# Patient Record
Sex: Male | Born: 1985 | Hispanic: Yes | Marital: Married | Smoking: Former smoker
Health system: Southern US, Community
[De-identification: ages and names within clinical notes are randomized; demographics above are authoritative.]

---

## 2008-12-29 ENCOUNTER — Ambulatory Visit: Payer: Self-pay | Admitting: Internal Medicine

## 2010-08-16 ENCOUNTER — Ambulatory Visit: Payer: Self-pay | Admitting: Internal Medicine

## 2010-09-05 ENCOUNTER — Emergency Department: Payer: Self-pay | Admitting: Emergency Medicine

## 2011-05-05 ENCOUNTER — Emergency Department: Payer: Self-pay | Admitting: Internal Medicine

## 2011-05-17 ENCOUNTER — Emergency Department: Payer: Self-pay | Admitting: *Deleted

## 2011-05-30 ENCOUNTER — Emergency Department: Payer: Self-pay | Admitting: Emergency Medicine

## 2011-08-15 ENCOUNTER — Emergency Department: Payer: Self-pay | Admitting: *Deleted

## 2011-08-15 LAB — URINALYSIS, COMPLETE
Blood: NEGATIVE
Glucose,UR: NEGATIVE mg/dL (ref 0–75)
Leukocyte Esterase: NEGATIVE
Nitrite: NEGATIVE
Ph: 7 (ref 4.5–8.0)
Specific Gravity: 1.006 (ref 1.003–1.030)
Squamous Epithelial: NONE SEEN

## 2012-01-11 ENCOUNTER — Ambulatory Visit: Payer: Self-pay | Admitting: Nephrology

## 2012-02-06 ENCOUNTER — Ambulatory Visit: Payer: Self-pay | Admitting: Internal Medicine

## 2012-02-06 LAB — URINALYSIS, COMPLETE
Bacteria: NEGATIVE
Bilirubin,UR: NEGATIVE
Blood: NEGATIVE
Glucose,UR: NEGATIVE mg/dL (ref 0–75)
Ketone: NEGATIVE
Leukocyte Esterase: NEGATIVE
Nitrite: NEGATIVE
Ph: 5 (ref 4.5–8.0)
Protein: NEGATIVE
RBC,UR: NONE SEEN /HPF (ref 0–5)
Specific Gravity: >=1.03 (ref 1.003–1.030)
Squamous Epithelial: NONE SEEN

## 2012-07-10 ENCOUNTER — Ambulatory Visit: Payer: Self-pay | Admitting: Family Medicine

## 2013-01-17 IMAGING — CT CT STONE STUDY
1 of 2 series · 15 of 32 positions shown, 19 images · non-contrast
Comparison: none

REASON FOR EXAM: Horseshoe Kidney Abd Pain Kidney Stones
COMMENTS:

[Series 2: abd 3mm wo 3.0 i40f 3 · axial · 0.78mm/px · z∈[-979,-592]mm · 15 of 146 slices shown, 19 images]
[im 11/146  soft-tissue]
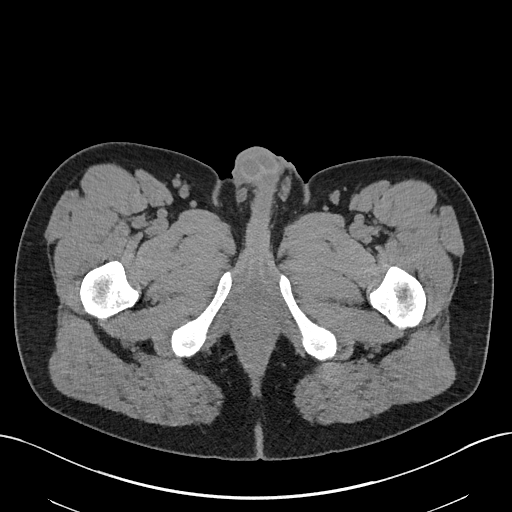
[im 11/146  bone]
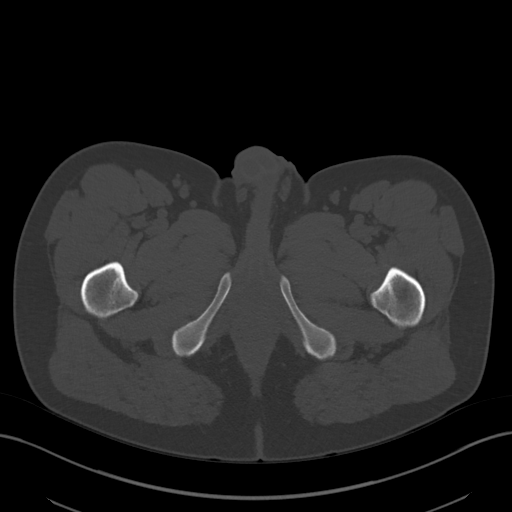
[im 22/146  soft-tissue]
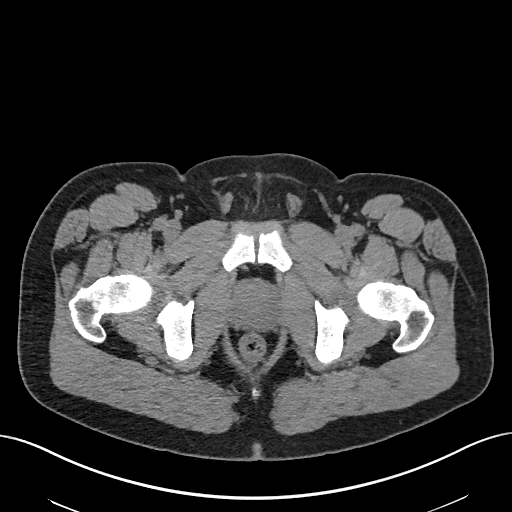
[im 33/146  soft-tissue]
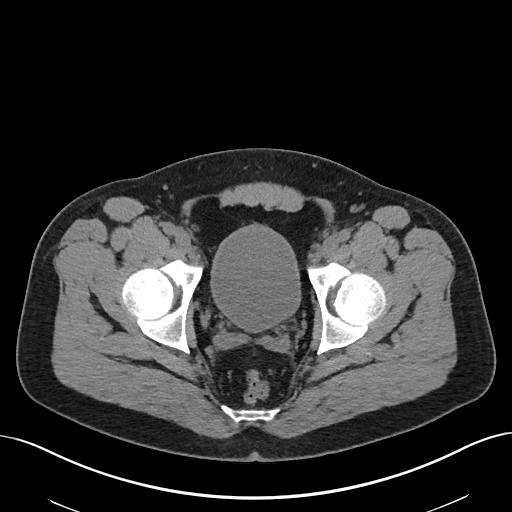
[im 43/146  soft-tissue]
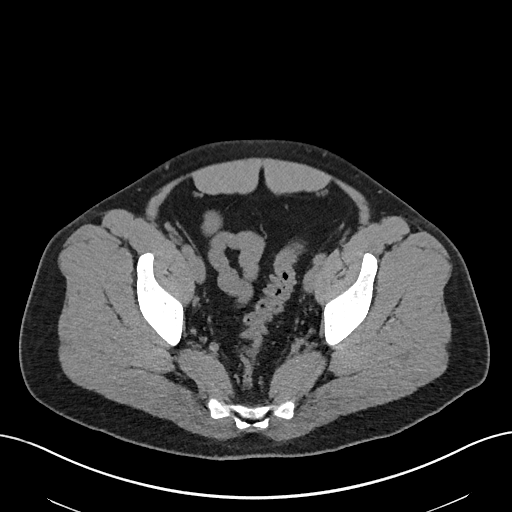
[im 54/146  soft-tissue]
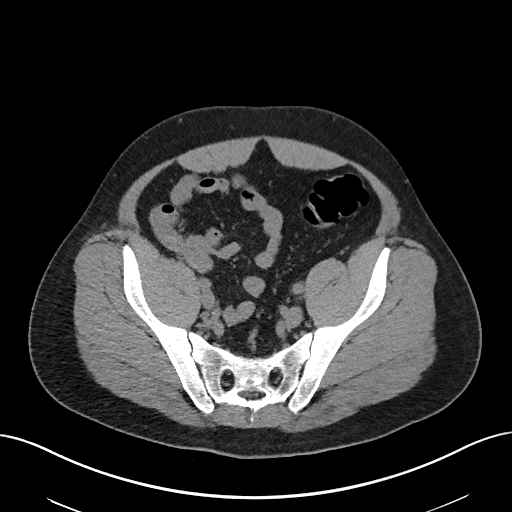
[im 65/146  soft-tissue]
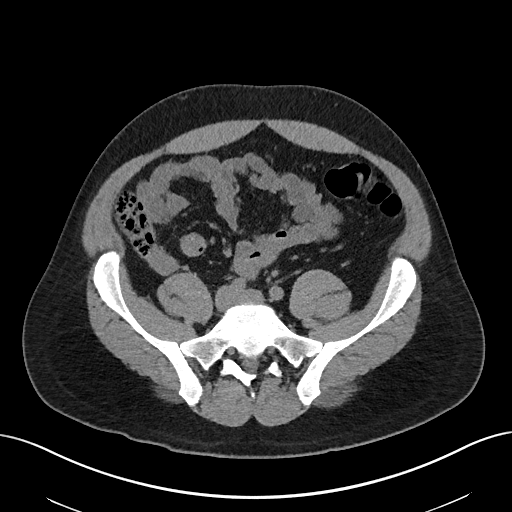
[im 76/146  soft-tissue]
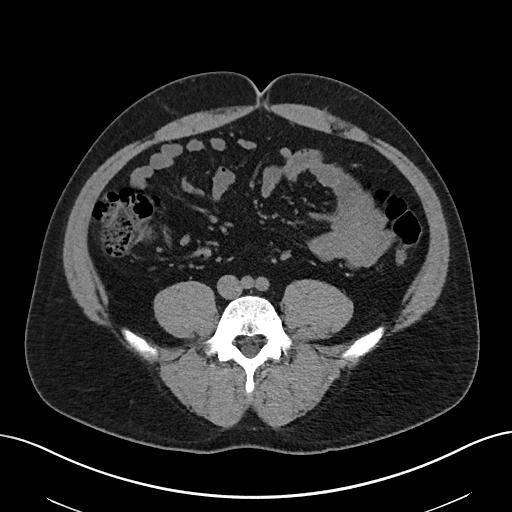
[im 86/146  soft-tissue]
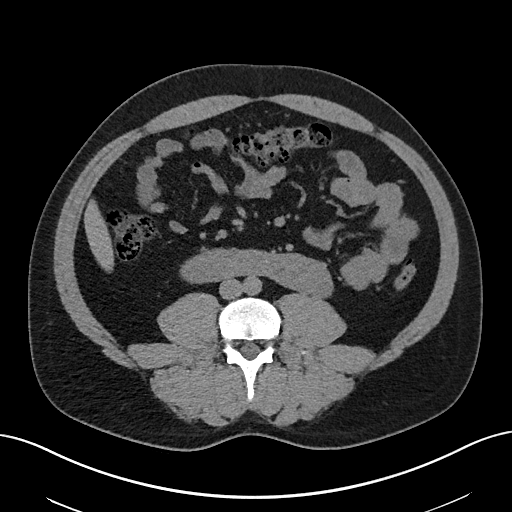
[im 97/146  soft-tissue]
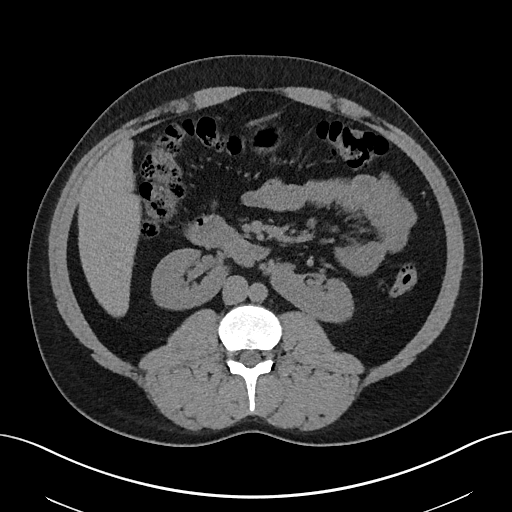
[im 97/146  bone]
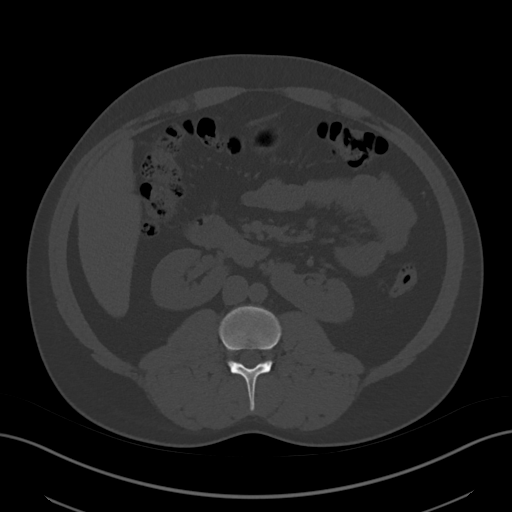
[im 108/146  soft-tissue]
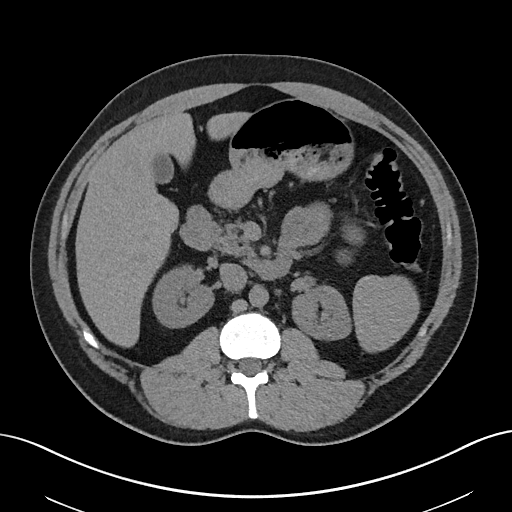
[im 119/146  soft-tissue]
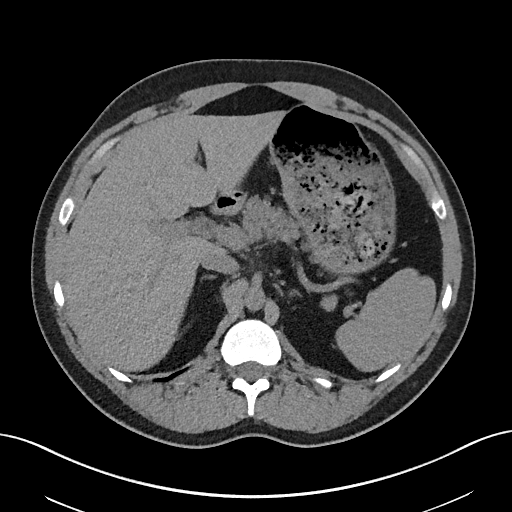
[im 124/146  lung]
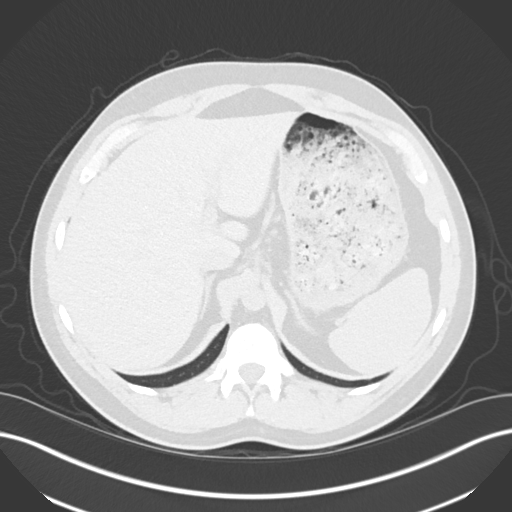
[im 129/146  soft-tissue]
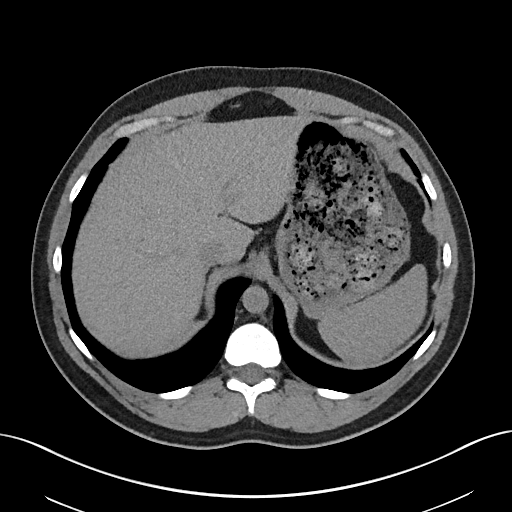
[im 129/146  lung]
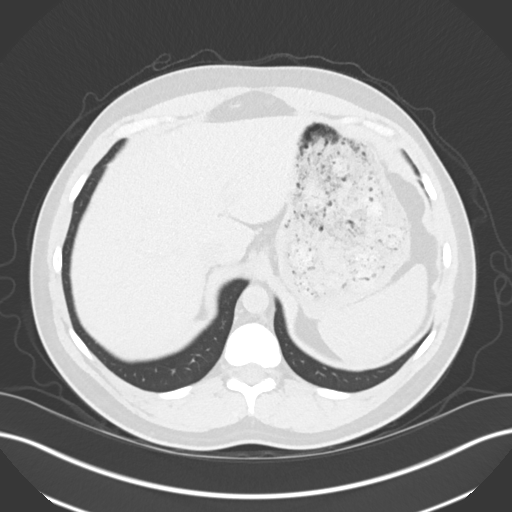
[im 135/146  lung]
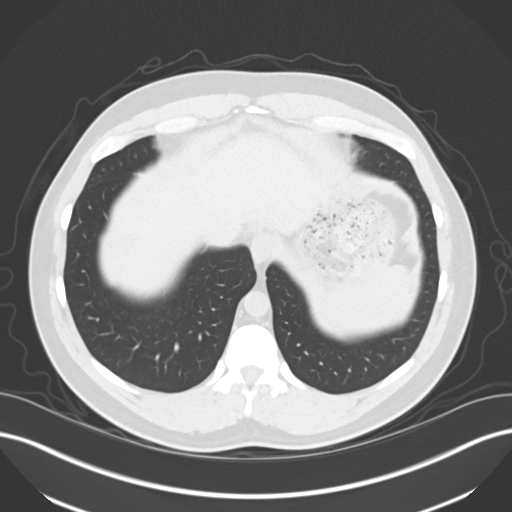
[im 140/146  soft-tissue]
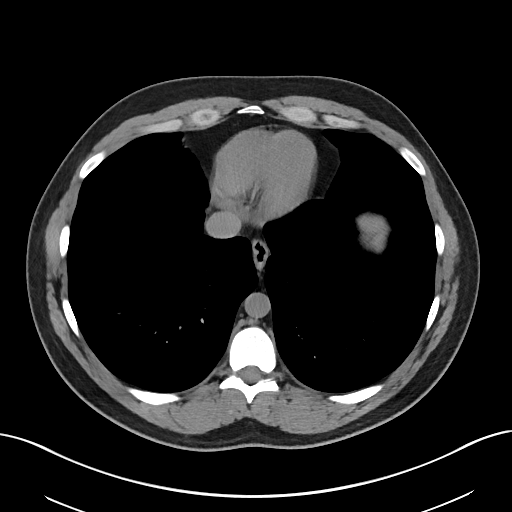
[im 140/146  lung]
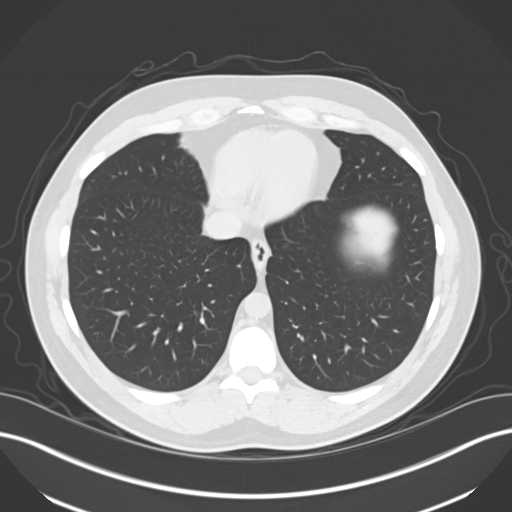

[15 of 32 positions shown; findings below may reference images not displayed]

PROCEDURE:     KCT - KCT ABDOMEN/PELVIS WO (STONE)  - January 11, 2012  [DATE]

RESULT:     Noncontrast CT of the abdomen and pelvis is compared to the
study 30 May, 2011.

There is a horseshoe kidney. In the midline region there is a punctate
calcification seen on image #57, which is unchanged compared to image #63 of
the previous exam. This could represent a parenchymal calcification rather
than a nonobstructing collecting system stone. There is a 1 mm lower pole
right nonobstructing stone possibly with a mid right kidney 1 mm
nonobstructing stone. These are extremely tiny and best viewed on the thin
slice reconstructions created with [employer]ia. No other parenchymal
calcifications are suggested. The gallbladder is nondistended. The liver,
spleen, pancreas, aorta, adrenal glands, stomach and bowel appear
unremarkable. The appendix is normal in appearance. The urinary bladder
contains no stones. The prostate appears unremarkable. Phleboliths are seen
in the left pelvis. No adenopathy is evident. The lung bases are clear.
IMPRESSION: 1. Punctate focus of calcification in the midline in the fused area of the
kidneys likely parenchymal.
2. Tiny right renal calculi without obstruction.
3. No left nephrolithiasis evident. No definite ureteral stones are
demonstrated.

[REDACTED]

## 2013-03-25 ENCOUNTER — Encounter: Payer: Self-pay | Admitting: Rheumatology

## 2013-03-31 ENCOUNTER — Encounter: Payer: Self-pay | Admitting: Rheumatology

## 2021-07-24 ENCOUNTER — Other Ambulatory Visit: Payer: Self-pay

## 2021-07-24 ENCOUNTER — Emergency Department: Payer: Self-pay

## 2021-07-24 ENCOUNTER — Emergency Department
Admission: EM | Admit: 2021-07-24 | Discharge: 2021-07-24 | Disposition: A | Discharge status: Home / Self Care | Payer: Self-pay | Attending: Emergency Medicine | Admitting: Emergency Medicine

## 2021-07-24 ENCOUNTER — Encounter: Payer: Self-pay | Admitting: Emergency Medicine

## 2021-07-24 DIAGNOSIS — R112 Nausea with vomiting, unspecified: Secondary | ICD-10-CM | POA: Insufficient documentation

## 2021-07-24 DIAGNOSIS — Z87891 Personal history of nicotine dependence: Secondary | ICD-10-CM | POA: Insufficient documentation

## 2021-07-24 DIAGNOSIS — R1013 Epigastric pain: Secondary | ICD-10-CM | POA: Insufficient documentation

## 2021-07-24 DIAGNOSIS — I10 Essential (primary) hypertension: Secondary | ICD-10-CM | POA: Insufficient documentation

## 2021-07-24 DIAGNOSIS — R1033 Periumbilical pain: Secondary | ICD-10-CM | POA: Insufficient documentation

## 2021-07-24 LAB — COMPREHENSIVE METABOLIC PANEL
ALT: 18 U/L (ref 0–44)
AST: 19 U/L (ref 15–41)
Albumin: 4.9 g/dL (ref 3.5–5.0)
Alkaline Phosphatase: 57 U/L (ref 38–126)
Anion gap: 9 (ref 5–15)
BUN: 16 mg/dL (ref 6–20)
CO2: 24 mmol/L (ref 22–32)
Calcium: 9.8 mg/dL (ref 8.9–10.3)
Chloride: 105 mmol/L (ref 98–111)
Creatinine, Ser: 1.12 mg/dL (ref 0.61–1.24)
GFR, Estimated: >60 mL/min (ref >60)
Glucose, Bld: 160 mg/dL — ABNORMAL HIGH (ref 70–99)
Potassium: 3.9 mmol/L (ref 3.5–5.1)
Sodium: 138 mmol/L (ref 135–145)
Total Bilirubin: 0.9 mg/dL (ref 0.3–1.2)
Total Protein: 8.2 g/dL — ABNORMAL HIGH (ref 6.5–8.1)

## 2021-07-24 LAB — CBC
HCT: 49.7 % (ref 39.0–52.0)
Hemoglobin: 17.1 g/dL — ABNORMAL HIGH (ref 13.0–17.0)
MCH: 29.6 pg (ref 26.0–34.0)
MCHC: 34.4 g/dL (ref 30.0–36.0)
MCV: 86 fL (ref 80.0–100.0)
Platelets: 301 10*3/uL (ref 150–400)
RBC: 5.78 MIL/uL (ref 4.22–5.81)
RDW: 12.3 % (ref 11.5–15.5)
WBC: 18.4 10*3/uL — ABNORMAL HIGH (ref 4.0–10.5)
nRBC: 0 % (ref 0.0–0.2)

## 2021-07-24 LAB — LIPASE, BLOOD: Lipase: 28 U/L (ref 11–51)

## 2021-07-24 LAB — TROPONIN I (HIGH SENSITIVITY): Troponin I (High Sensitivity): 4 ng/L (ref <18)

## 2021-07-24 MED ORDER — LABETALOL HCL 5 MG/ML IV SOLN
10.0000 mg | Freq: Once | INTRAVENOUS | Status: AC
  Administered 2021-07-24: 14:00:00 10 mg via INTRAVENOUS
  Filled 2021-07-24: qty 4

## 2021-07-24 MED ORDER — LABETALOL HCL 5 MG/ML IV SOLN: 10.0000 mg | Freq: Once | INTRAVENOUS | Status: DC

## 2021-07-24 MED ORDER — OMEPRAZOLE MAGNESIUM 20 MG PO TBEC: 20.0000 mg | DELAYED_RELEASE_TABLET | Freq: Every day | ORAL | 0 refills | Status: AC

## 2021-07-24 MED ORDER — DROPERIDOL 2.5 MG/ML IJ SOLN
2.5000 mg | Freq: Once | INTRAMUSCULAR | Status: AC
  Administered 2021-07-24: 10:00:00 2.5 mg via INTRAVENOUS
  Filled 2021-07-24: qty 2

## 2021-07-24 MED ORDER — IOHEXOL 300 MG/ML  SOLN
100.0000 mL | Freq: Once | INTRAMUSCULAR | Status: AC | PRN
  Administered 2021-07-24: 11:00:00 100 mL via INTRAVENOUS

## 2021-07-24 MED ORDER — HYDROMORPHONE HCL 1 MG/ML IJ SOLN
1.0000 mg | Freq: Once | INTRAMUSCULAR | Status: AC
  Administered 2021-07-24: 10:00:00 1 mg via INTRAVENOUS
  Filled 2021-07-24: qty 1

## 2021-07-24 MED ORDER — IOHEXOL 350 MG/ML SOLN: 100.0000 mL | Freq: Once | INTRAVENOUS | Status: DC | PRN

## 2021-07-24 MED ORDER — MORPHINE SULFATE (PF) 4 MG/ML IV SOLN
4.0000 mg | Freq: Once | INTRAVENOUS | Status: AC
  Administered 2021-07-24: 13:00:00 4 mg via INTRAVENOUS
  Filled 2021-07-24: qty 1

## 2021-07-24 MED ORDER — ONDANSETRON HCL 4 MG/2ML IJ SOLN
4.0000 mg | Freq: Once | INTRAMUSCULAR | Status: AC
  Administered 2021-07-24: 10:00:00 4 mg via INTRAVENOUS
  Filled 2021-07-24: qty 2

## 2021-07-24 MED ORDER — PANTOPRAZOLE SODIUM 40 MG IV SOLR
40.0000 mg | Freq: Once | INTRAVENOUS | Status: AC
  Administered 2021-07-24: 13:00:00 40 mg via INTRAVENOUS
  Filled 2021-07-24: qty 40

## 2021-07-24 MED ORDER — PROMETHAZINE HCL 25 MG PO TABS: 25.0000 mg | ORAL_TABLET | Freq: Three times a day (TID) | ORAL | 0 refills | Status: AC | PRN

## 2021-07-24 NOTE — ED Notes (Signed)
See triage note, pt reports epigastric pain that started at 0100, +n/v.  Denies chronic ETOH use. Pt appears uncomfortable

## 2021-07-24 NOTE — ED Triage Notes (Signed)
Pt via POV from home. Pt c/o epigastric abd pain since midnight last night. States that it is a cramping sensation. Pt also endorses NV. Denies fever Pt is A&Ox4 and NAD.

## 2021-07-24 NOTE — ED Notes (Signed)
EDP at bedside  

## 2021-07-24 NOTE — Discharge Instructions (Signed)
For your abdominal pain:  Take over-the-counter Prilosec/Omeprazole or other over-the-counter Proton Pump Inhibitor (you can ask the pharmacist) daily for 14 days  Avoid spicy foods or foods high in acid  Avoid alcohol and marijuana  You can take Pepcid / Famotidine as well as needed  Take the phenergan for nausea  FOR YOUR BLOOD PRESSURE  Try to keep track of this by measuring at least 1-2x per week  If it remains >140 on top or >90 on bottom, call the numbers above for a primary care appointment

## 2021-07-24 NOTE — ED Provider Notes (Signed)
[facility] Emergency Department Provider Note  ____________________________________________   Event Date/Time   First MD Initiated Contact with Patient 07/24/21 [phone]     (approximate)  I have reviewed the triage vital signs and the nursing notes.   HISTORY  Chief Complaint Abdominal Pain    HPI Larry Rosales is a 35 y.o. male  with no significant PMHx here with abdominal pain. Pt reports he woke up this AM with acute, severe, epigastric abdominal pain along with nausea, vomiting. He states he ate a soup with hot sauce/lemon in it around midnight, thought it was that. He woke up with severe, sharp, stabbing, gnawing epigastric paint hat has persisted. He vomited 2-3x with NBNB emesis, with mild but transient improvement in pain. No diarrhea. No fevers, chills. No one else who ate the soup is sick. No known sick contacts. H/o kidney stones. No intra-abd surgeries.       No past medical history on file.  There are no problems to display for this patient.     Prior to Admission medications   Medication Sig Start Date End Date Taking? Authorizing Provider  omeprazole (PRILOSEC OTC) 20 MG tablet Take 1 tablet (20 mg total) by mouth daily for 14 days. 07/24/21 08/07/21 Yes Shaune Pollack, MD  promethazine (PHENERGAN) 25 MG tablet Take 1 tablet (25 mg total) by mouth every 8 (eight) hours as needed for nausea or vomiting. 07/24/21  Yes Shaune Pollack, MD    Allergies Patient has no known allergies.  No family history on file.  Social History Social History   Tobacco Use   Smoking status: Former    Types: Cigarettes   Smokeless tobacco: Never    Review of Systems  Review of Systems  Constitutional:  Negative for chills and fever.  HENT:  Negative for sore throat.   Respiratory:  Negative for shortness of breath.   Cardiovascular:  Negative for chest pain.  Gastrointestinal:  Positive for abdominal pain, nausea and vomiting.   Genitourinary:  Negative for flank pain.  Musculoskeletal:  Negative for neck pain.  Skin:  Negative for rash and wound.  Allergic/Immunologic: Negative for immunocompromised state.  Neurological:  Negative for weakness and numbness.  Hematological:  Does not bruise/bleed easily.  All other systems reviewed and are negative.   ____________________________________________  PHYSICAL EXAM:      VITAL SIGNS: ED Triage Vitals  Enc Vitals Group     BP 07/24/21 0923 (!) 142/114     Pulse Rate 07/24/21 0923 68     Resp 07/24/21 0923 20     Temp 07/24/21 0923 98.3 F (36.8 C)     Temp Source 07/24/21 0923 Oral     SpO2 07/24/21 0923 99 %     Weight 07/24/21 0924 185 lb (83.9 kg)     Height 07/24/21 0924 5\' 9"  (1.753 m)     Head Circumference --      Peak Flow --      Pain Score 07/24/21 0924 10     Pain Loc --      Pain Edu? --      Excl. in GC? --      Physical Exam Vitals and nursing note reviewed.  Constitutional:      General: He is not in acute distress.    Appearance: He is well-developed.  HENT:     Head: Normocephalic and atraumatic.  Eyes:     Conjunctiva/sclera: Conjunctivae normal.  Cardiovascular:     Rate and  Rhythm: Normal rate and regular rhythm.     Heart sounds: Normal heart sounds. No murmur heard.   No friction rub.  Pulmonary:     Effort: Pulmonary effort is normal. No respiratory distress.     Breath sounds: Normal breath sounds. No wheezing or rales.  Abdominal:     General: There is no distension.     Palpations: Abdomen is soft.     Tenderness: There is abdominal tenderness in the epigastric area and periumbilical area. There is guarding. There is no rebound.  Musculoskeletal:     Cervical back: Neck supple.  Skin:    General: Skin is warm.     Capillary Refill: Capillary refill takes less than 2 seconds.  Neurological:     Mental Status: He is alert and oriented to person, place, and time.     Motor: No abnormal muscle tone.       ____________________________________________   LABS (all labs ordered are listed, but only abnormal results are displayed)  Labs Reviewed  COMPREHENSIVE METABOLIC PANEL - Abnormal; Notable for the following components:      Result Value   Glucose, Bld 160 (*)    Total Protein 8.2 (*)    All other components within normal limits  CBC - Abnormal; Notable for the following components:   WBC 18.4 (*)    Hemoglobin 17.1 (*)    All other components within normal limits  LIPASE, BLOOD  URINALYSIS, ROUTINE W REFLEX MICROSCOPIC  TROPONIN I (HIGH SENSITIVITY)    ____________________________________________  EKG: Normal sinus rhythm, VR 85. PR 134, QRS 92, QTc 445.  Nonspecific ST changes.  No acute ST elevations or depressions.  No EKG evidence of acute ischemia or infarct. ________________________________________  RADIOLOGY All imaging, including plain films, CT scans, and ultrasounds, independently reviewed by me, and interpretations confirmed via formal radiology reads.  ED MD interpretation:   CT abdomen/pelvis:no acute abdominal pain, GB distended without acute cholecystitis  Official radiology report(s): CT ABDOMEN PELVIS W CONTRAST  Result Date: 07/24/2021 CLINICAL DATA:  Abdominal pain, acute, nonlocalized EXAM: CT ABDOMEN AND PELVIS WITH CONTRAST TECHNIQUE: Multidetector CT imaging of the abdomen and pelvis was performed using the standard protocol following bolus administration of intravenous contrast. CONTRAST:  OMNIPAQUE IOHEXOL 300 MG/ML  SOLN COMPARISON:  January 11, 2012 FINDINGS: Lower chest: No acute abnormality. Hepatobiliary: Focal fatty deposition adjacent to the falciform ligament. Portal vein is patent. No intrahepatic or extrahepatic biliary ductal dilation. Gallbladder is distended without ancillary evidence of acute cholecystitis. Pancreas: Unremarkable. No pancreatic ductal dilatation or surrounding inflammatory changes. Spleen: Normal in size without focal  abnormality. Adrenals/Urinary Tract: Adrenal glands are unremarkable. Revisualization of a horseshoe kidney. No evidence of hydronephrosis. The moieties enhance symmetrically. RIGHT moiety renal cyst. Punctate nonobstructive interpolar nephrolithiasis. Subcentimeter hypodense lesions are too small to accurately characterize. Bladder is decompressed. Stomach/Bowel: No evidence of bowel obstruction. Appendix is normal. Stomach is decompressed. Vascular/Lymphatic: No significant vascular findings are present. No enlarged abdominal or pelvic lymph nodes. Reproductive: Prostate is unremarkable. Other: No abdominal wall hernia or abnormality. No abdominopelvic ascites. Musculoskeletal: No acute or significant osseous findings. IMPRESSION: 1. No definitive CT etiology for acute abdominal pain identified. Normal appendix. 2. Gallbladder is distended without ancillary evidence of acute cholecystitis. Recommend correlation with physical exam. This can be seen in the setting of fasting. 3. Revisualization of a horseshoe kidney. Electronically Signed   By: Meda Klinefelter M.D.   On: 07/24/2021 11:10    ____________________________________________  PROCEDURES  Procedure(s) performed (including Critical Care):  Procedures  ____________________________________________  INITIAL IMPRESSION / MDM / ASSESSMENT AND PLAN / ED COURSE  As part of my medical decision making, I reviewed the following data within the electronic MEDICAL RECORD NUMBER Nursing notes reviewed and incorporated, Old chart reviewed, Notes from prior ED visits, and [place] Controlled Substance Database       *Larry Rosales was evaluated in Emergency Department on 07/24/2021 for the symptoms described in the history of present illness. He was evaluated in the context of the global COVID-19 pandemic, which necessitated consideration that the patient might be at risk for infection with the SARS-CoV-2 virus that causes COVID-19. Institutional protocols  and algorithms that pertain to the evaluation of patients at risk for COVID-19 are in a state of rapid change based on information released by regulatory bodies including the CDC and federal and state organizations. These policies and algorithms were followed during the patient's care in the ED.  Some ED evaluations and interventions may be delayed as a result of limited staffing during the pandemic.*     Medical Decision Making:  35 yo M with no significant PMHx here with significant upper abdominal pain, nausea, vomiting. DDx includes gastritis, food borne illness, possible THC-related hyperemesis. Pt given meds, antiemetics with significant improvement. Labs show likely reactive leukocytosis, but normal LFTs and lipase. No evidence to suggest cholecystitis, cholangitis, pancreatitis. No urinary sx. Abdomen is soft after fluids, meds, symptomatic management. CT scan obtaine,d reviewed as above, and shows no acute pathology. EKG nonischemic, trop is negative. Pt is noted to be hypertensive here, which I suspect is 2/2 ED visit, pain, vomiting. Given normal EKG and trop, doubt any ischemic/hypertensive emergency. Will dc with antiemetics and symptom control, advise him to check his BP at home and f/u with PCP.  ____________________________________________  FINAL CLINICAL IMPRESSION(S) / ED DIAGNOSES  Final diagnoses:  Epigastric pain  Primary hypertension  Nausea and vomiting, unspecified vomiting type     MEDICATIONS GIVEN DURING THIS VISIT:  Medications  iohexol (OMNIPAQUE) 350 MG/ML injection 100 mL (has no administration in time range)  HYDROmorphone (DILAUDID) injection 1 mg (1 mg Intravenous Given 07/24/21 0959)  ondansetron (ZOFRAN) injection 4 mg (4 mg Intravenous Given 07/24/21 0958)  droperidol (INAPSINE) 2.5 MG/ML injection 2.5 mg (2.5 mg Intravenous Given 07/24/21 1027)  iohexol (OMNIPAQUE) 300 MG/ML solution 100 mL (100 mLs Intravenous Contrast Given 07/24/21 1043)  morphine 4  MG/ML injection 4 mg (4 mg Intravenous Given 07/24/21 1305)  pantoprazole (PROTONIX) injection 40 mg (40 mg Intravenous Given 07/24/21 1307)  labetalol (NORMODYNE) injection 10 mg (10 mg Intravenous Given 07/24/21 1359)     ED Discharge Orders          Ordered    omeprazole (PRILOSEC OTC) 20 MG tablet  Daily        07/24/21 1421    promethazine (PHENERGAN) 25 MG tablet  Every 8 hours PRN        07/24/21 1421             Note:  This document was prepared using Dragon voice recognition software and may include unintentional dictation errors.   Shaune Pollack, MD 07/24/21 [phone]

## 2021-07-24 NOTE — ED Notes (Signed)
Pt to CT

## 2021-07-24 NOTE — ED Notes (Signed)
Pt resting comfortably at this time. Call bell in reach. Family at bedside
